# Patient Record
Sex: Female | Born: 1960 | Race: White | Hispanic: No | Marital: Married | Smoking: Never smoker
Health system: Southern US, Community
[De-identification: ages and names within clinical notes are randomized; demographics above are authoritative.]

## PROBLEM LIST (undated history)

## (undated) DIAGNOSIS — K449 Diaphragmatic hernia without obstruction or gangrene: Secondary | ICD-10-CM

## (undated) DIAGNOSIS — K219 Gastro-esophageal reflux disease without esophagitis: Secondary | ICD-10-CM

## (undated) DIAGNOSIS — E049 Nontoxic goiter, unspecified: Secondary | ICD-10-CM

## (undated) HISTORY — PX: ENDOSCOPIC LUMBAR DISCECTOMY W/ LASER: SUR438

---

## 2015-09-05 ENCOUNTER — Other Ambulatory Visit: Payer: Self-pay | Admitting: Family Medicine

## 2015-09-05 DIAGNOSIS — Z1231 Encounter for screening mammogram for malignant neoplasm of breast: Secondary | ICD-10-CM

## 2015-09-06 ENCOUNTER — Ambulatory Visit
Admission: RE | Admit: 2015-09-06 | Discharge: 2015-09-06 | Disposition: A | Discharge status: Home / Self Care | Payer: Managed Care, Other (non HMO) | Source: Ambulatory Visit | Attending: Family Medicine | Admitting: Family Medicine

## 2015-09-06 DIAGNOSIS — Z1231 Encounter for screening mammogram for malignant neoplasm of breast: Secondary | ICD-10-CM | POA: Diagnosis present

## 2015-09-17 ENCOUNTER — Other Ambulatory Visit: Payer: Self-pay | Admitting: Family Medicine

## 2015-09-17 DIAGNOSIS — R928 Other abnormal and inconclusive findings on diagnostic imaging of breast: Secondary | ICD-10-CM

## 2015-10-08 ENCOUNTER — Ambulatory Visit
Admission: RE | Admit: 2015-10-08 | Discharge: 2015-10-08 | Disposition: A | Discharge status: Home / Self Care | Payer: Managed Care, Other (non HMO) | Source: Ambulatory Visit | Attending: Family Medicine | Admitting: Family Medicine

## 2015-10-08 DIAGNOSIS — R928 Other abnormal and inconclusive findings on diagnostic imaging of breast: Secondary | ICD-10-CM | POA: Diagnosis not present

## 2019-05-12 ENCOUNTER — Emergency Department
Admission: EM | Admit: 2019-05-12 | Discharge: 2019-05-13 | Disposition: A | Discharge status: Home / Self Care | Payer: 59 | Attending: Emergency Medicine | Admitting: Emergency Medicine

## 2019-05-12 ENCOUNTER — Emergency Department: Payer: 59

## 2019-05-12 ENCOUNTER — Encounter: Payer: Self-pay | Admitting: Emergency Medicine

## 2019-05-12 ENCOUNTER — Other Ambulatory Visit: Payer: Self-pay

## 2019-05-12 DIAGNOSIS — I10 Essential (primary) hypertension: Secondary | ICD-10-CM | POA: Diagnosis not present

## 2019-05-12 DIAGNOSIS — R0602 Shortness of breath: Secondary | ICD-10-CM | POA: Insufficient documentation

## 2019-05-12 DIAGNOSIS — Z880 Allergy status to penicillin: Secondary | ICD-10-CM | POA: Insufficient documentation

## 2019-05-12 DIAGNOSIS — R0789 Other chest pain: Secondary | ICD-10-CM | POA: Insufficient documentation

## 2019-05-12 DIAGNOSIS — E785 Hyperlipidemia, unspecified: Secondary | ICD-10-CM | POA: Insufficient documentation

## 2019-05-12 DIAGNOSIS — R079 Chest pain, unspecified: Secondary | ICD-10-CM

## 2019-05-12 LAB — BASIC METABOLIC PANEL
Anion gap: 8 (ref 5–15)
BUN: 16 mg/dL (ref 6–20)
CO2: 26 mmol/L (ref 22–32)
Calcium: 9.9 mg/dL (ref 8.9–10.3)
Chloride: 106 mmol/L (ref 98–111)
Creatinine, Ser: 0.61 mg/dL (ref 0.44–1.00)
GFR calc Af Amer: >60 mL/min (ref >60)
GFR calc non Af Amer: >60 mL/min (ref >60)
Glucose, Bld: 113 mg/dL — ABNORMAL HIGH (ref 70–99)
Potassium: 3.9 mmol/L (ref 3.5–5.1)
Sodium: 140 mmol/L (ref 135–145)

## 2019-05-12 LAB — CBC
HCT: 45.2 % (ref 36.0–46.0)
Hemoglobin: 14.6 g/dL (ref 12.0–15.0)
MCH: 29.6 pg (ref 26.0–34.0)
MCHC: 32.3 g/dL (ref 30.0–36.0)
MCV: 91.7 fL (ref 80.0–100.0)
Platelets: 339 10*3/uL (ref 150–400)
RBC: 4.93 MIL/uL (ref 3.87–5.11)
RDW: 14.6 % (ref 11.5–15.5)
WBC: 14.4 10*3/uL — ABNORMAL HIGH (ref 4.0–10.5)
nRBC: 0 % (ref 0.0–0.2)

## 2019-05-12 LAB — TROPONIN I (HIGH SENSITIVITY)
Troponin I (High Sensitivity): <2 ng/L (ref <18)
Troponin I (High Sensitivity): 4 ng/L (ref <18)

## 2019-05-12 LAB — FIBRIN DERIVATIVES D-DIMER (ARMC ONLY): Fibrin derivatives D-dimer (ARMC): 168.67 ng/mL (FEU) (ref 0.00–499.00)

## 2019-05-12 MED ORDER — SODIUM CHLORIDE 0.9% FLUSH: 3.0000 mL | Freq: Once | INTRAVENOUS | Status: DC

## 2019-05-12 MED ORDER — LORAZEPAM 2 MG/ML IJ SOLN
1.0000 mg | Freq: Once | INTRAMUSCULAR | Status: AC | PRN
  Administered 2019-05-13: 1 mg via INTRAVENOUS
  Filled 2019-05-12: qty 1

## 2019-05-12 MED ORDER — SODIUM CHLORIDE 0.9 % IV BOLUS
1000.0000 mL | Freq: Once | INTRAVENOUS | Status: AC
  Administered 2019-05-13: 1000 mL via INTRAVENOUS

## 2019-05-12 NOTE — ED Provider Notes (Signed)
[facility] Emergency Department Provider Note  ____________________________________________   First MD Initiated Contact with Patient 05/12/19 2220     (approximate)  I have reviewed the triage vital signs and the nursing notes.  History  Chief Complaint Chest Pain    HPI Lauren Ball is a 59 y.o. female recently diagnosed with HTN, HLD who presents to the ED for left sided chest pain. Pain started this afternoon without any identifiable trigger. Sharp, left sided and radiates to her shoulder/back and her left arm. No alleviating/aggravating factors. Constant since onset. Associated with mild SOB earlier, which is improving. No nausea or diaphoresis. Recently started on prednisone by her PCP yesterday for back pain. No hx of CAD, DVT or VTE. No leg swelling or hemoptysis.    Past Medical Hx History reviewed. No pertinent past medical history.  Problem List There are no problems to display for this patient.   Past Surgical Hx History reviewed. No pertinent surgical history.  Medications Prior to Admission medications   Not on File    Allergies Penicillin g  Family Hx Family History  Problem Relation Age of Onset  . Breast cancer Neg Hx     Social Hx Social History   Tobacco Use  . Smoking status: Never Smoker  . Smokeless tobacco: Never Used  Substance Use Topics  . Alcohol use: Not Currently  . Drug use: Not on file     Review of Systems  Constitutional: Negative for fever, chills. Eyes: Negative for visual changes. ENT: Negative for sore throat. Cardiovascular: + for chest pain. Respiratory: + for shortness of breath. Gastrointestinal: Negative for nausea, vomiting.  Genitourinary: Negative for dysuria. Musculoskeletal: Negative for leg swelling. Skin: Negative for rash. Neurological: Negative for headaches.   Physical Exam  Vital Signs: ED Triage Vitals  Enc Vitals Group     BP 05/12/19 1840 (!) 180/90   Pulse Rate 05/12/19 1840 (!) 119     Resp 05/12/19 1840 18     Temp 05/12/19 1840 98.6 F (37 C)     Temp Source 05/12/19 1840 Oral     SpO2 05/12/19 1840 99 %     Weight 05/12/19 1841 200 lb (90.7 kg)     Height 05/12/19 1841 5\' 3"  (1.6 m)     Head Circumference --      Peak Flow --      Pain Score 05/12/19 1841 9     Pain Loc --      Pain Edu? --      Excl. in GC? --     Constitutional: Alert and oriented.  Head: Normocephalic. Atraumatic. Eyes: Conjunctivae clear. Sclera anicteric. Nose: No congestion. No rhinorrhea. Mouth/Throat: Wearing mask.  Neck: No stridor.   Cardiovascular: Tachycardic, regular rhythm. Extremities well perfused. Respiratory: Normal respiratory effort.  Lungs CTAB. Gastrointestinal: Soft. Non-tender. Non-distended.  Musculoskeletal: No lower extremity edema. No deformities. Neurologic:  Normal speech and language. No gross focal neurologic deficits are appreciated.  Skin: Skin is warm, dry and intact. No rash noted. Psychiatric: Mood and affect are appropriate for situation.  EKG  Personally reviewed.   Rate: 114 Rhythm: sinus Axis: normal Intervals: WNL Sinus tachycardia, no acute ischemic changes No STEMI    Radiology  CXR: IMPRESSION:  No active cardiopulmonary disease.   CT PE ordered, pending   Procedures  Procedure(s) performed (including critical care):  Procedures   Initial Impression / Assessment and Plan / ED Course  59 y.o. female who presents to the ED  for chest pain, SOB, as above.   Ddx: ACS, PE, pleurisy, anxiety, PTX.  Will obtain labs, EKG, imaging.   Patiently notably tachycardic on exam - states she is somewhat cold and anxious. Ddx for her tachycardia may be related to anxiety/feeling cold vs dehydration, though highest concern w/ her symptoms would be PE. Will give fluids, Ativan, and obtain CT PE to rule out.   EKG sinus tachycardia, no acute ischemic changes, troponin x 2 negative.   Pending CT PE,  if negative, anticipate discharge.    Final Clinical Impression(s) / ED Diagnosis  Final diagnoses:  Chest pain in adult       Note:  This document was prepared using Dragon voice recognition software and may include unintentional dictation errors.   Lilia Pro., MD 05/13/19 [phone]

## 2019-05-12 NOTE — ED Triage Notes (Signed)
Pt here with c/o left upper back pain at her shoulder blade states pain feels like it's going through to her left chest, pain in her left neck and left arm as well, began a few hours ago, was started on prednisone for back pain yesterday. NAD.

## 2019-05-12 NOTE — ED Notes (Signed)
2 unsuccesful IV attempts by this RN (right and left AC).

## 2019-05-12 NOTE — ED Notes (Signed)
Pt to ED c/o left sided CP that started this afternoon along with HTN. Reports she checked her BP this afternoon and it was 180's systolic. Reports her BP normally runs 130's systolic, takes losartan.  Denies N/V. Reports SHOB with CP earlier but denies SHOB at the moment .  Reports she started taking prednisone yesterday for back pain.  Pt in NAD at this time.

## 2019-05-13 ENCOUNTER — Emergency Department: Payer: 59

## 2019-05-13 LAB — T4, FREE: Free T4: 0.97 ng/dL (ref 0.61–1.12)

## 2019-05-13 LAB — CK: Total CK: 82 U/L (ref 38–234)

## 2019-05-13 LAB — TSH: TSH: 1.126 u[IU]/mL (ref 0.350–4.500)

## 2019-05-13 MED ORDER — IOHEXOL 350 MG/ML SOLN
75.0000 mL | Freq: Once | INTRAVENOUS | Status: AC | PRN
  Administered 2019-05-13: 01:00:00 75 mL via INTRAVENOUS

## 2019-05-13 MED ORDER — ONDANSETRON HCL 4 MG/2ML IJ SOLN
4.0000 mg | Freq: Once | INTRAMUSCULAR | Status: AC
  Administered 2019-05-13: 4 mg via INTRAVENOUS
  Filled 2019-05-13: qty 2

## 2019-05-13 MED ORDER — SODIUM CHLORIDE 0.9 % IV BOLUS
1000.0000 mL | Freq: Once | INTRAVENOUS | Status: AC
  Administered 2019-05-13: 1000 mL via INTRAVENOUS

## 2019-05-13 MED ORDER — FENTANYL CITRATE (PF) 100 MCG/2ML IJ SOLN
50.0000 ug | Freq: Once | INTRAMUSCULAR | Status: AC
  Administered 2019-05-13: 50 ug via INTRAVENOUS
  Filled 2019-05-13: qty 2

## 2019-05-13 NOTE — Discharge Instructions (Signed)
Your heart rate was elevated.  Your work-up was negative.  You should follow-up with your primary care doctor on Monday for heart rate recheck.  You should discontinue steroids for now.  Return to ER if you develop worsening chest pain, shortness of breath, fevers or any other concerns  IMPRESSION: No evidence of pulmonary embolus.   No acute cardiopulmonary disease.   Small hiatal hernia.

## 2019-05-13 NOTE — ED Provider Notes (Addendum)
1:32 AM Assumed care for off going team.   Blood pressure (!) 176/98, pulse (!) 126, temperature 98.6 F (37 C), temperature source Oral, resp. rate 18, height 5\' 3"  (1.6 m), weight 90.7 kg, last menstrual period 07/14/2015, SpO2 100 %.  See their HPI for full report but in brief Pending CT PE-Pt with chest pain, sob, tachycardiac. Ativan and fluids pending. Plan to d/c if CT negative.   White count slightly elevated but patient was to start on steroids.  Denies any infectious symptoms and patient is afebrile so I do not think she is septic.  HR has come down to 115.  Discussed with patient given she has a thyroid nodule and she was recently discontinued off her statin will check CK level and thyroid levels to make sure her neither of these are causing her heart rate issue.  We will give another 1 L of fluid and some pain medication to see if that could be contributing.  Discussed with patient that we could admit her for observation for cardiology and echo versus this could be secondary to the prednisone she was recently started on and she can follow-up with her primary care doctor on 2 days or return to the ER if her symptoms were worsening.   4:05 AM reevaluated patient heart rates come down to 110.  Discussed with patient admission for echocardiogram and cardiac monitoring versus discharge.  Patient is asymptomatic at this time and feels comfortable going home.  We discussed holding off on her steroids and she did have much improvement in her pain anyways and we can see if stopping this will help decrease her heart rates.  We we discussed Covid testing but she denies any other symptoms related to the is and declines testing at this time.    She plans to follow-up on Monday for a heart rate recheck with her primary care doctor.  Patient feels comfortable with this plan and will return to the ER if she develops any other concerns'    I discussed the provisional nature of ED diagnosis, the treatment  so far, the ongoing plan of care, follow up appointments and return precautions with the patient and any family or support people present. They expressed understanding and agreed with the plan, discharged home.          Thursday, MD 05/13/19 0407    05/15/19, MD 05/13/19 [phone]

## 2019-05-13 NOTE — ED Notes (Signed)
Family updated as to patient's status.

## 2019-05-13 NOTE — ED Notes (Signed)
Pt taken to CT.

## 2019-05-13 NOTE — ED Notes (Signed)
Pt denies cp at this time but st she is still having backpain

## 2021-07-22 ENCOUNTER — Other Ambulatory Visit: Payer: Self-pay | Admitting: Family Medicine

## 2021-07-22 DIAGNOSIS — Z1231 Encounter for screening mammogram for malignant neoplasm of breast: Secondary | ICD-10-CM

## 2021-07-23 ENCOUNTER — Ambulatory Visit
Admission: RE | Admit: 2021-07-23 | Discharge: 2021-07-23 | Disposition: A | Discharge status: Home / Self Care | Payer: No Typology Code available for payment source | Source: Ambulatory Visit | Attending: Family Medicine | Admitting: Family Medicine

## 2021-07-23 DIAGNOSIS — Z1231 Encounter for screening mammogram for malignant neoplasm of breast: Secondary | ICD-10-CM | POA: Diagnosis present

## 2021-07-25 ENCOUNTER — Other Ambulatory Visit: Payer: Self-pay | Admitting: Family Medicine

## 2021-07-25 DIAGNOSIS — N63 Unspecified lump in unspecified breast: Secondary | ICD-10-CM

## 2021-07-25 DIAGNOSIS — R928 Other abnormal and inconclusive findings on diagnostic imaging of breast: Secondary | ICD-10-CM

## 2021-08-05 ENCOUNTER — Ambulatory Visit
Admission: RE | Admit: 2021-08-05 | Discharge: 2021-08-05 | Disposition: A | Discharge status: Home / Self Care | Payer: No Typology Code available for payment source | Source: Ambulatory Visit | Attending: Family Medicine | Admitting: Family Medicine

## 2021-08-05 DIAGNOSIS — N63 Unspecified lump in unspecified breast: Secondary | ICD-10-CM | POA: Insufficient documentation

## 2021-08-05 DIAGNOSIS — R928 Other abnormal and inconclusive findings on diagnostic imaging of breast: Secondary | ICD-10-CM | POA: Insufficient documentation

## 2021-12-26 ENCOUNTER — Other Ambulatory Visit: Payer: Self-pay | Admitting: Family Medicine

## 2021-12-26 DIAGNOSIS — R928 Other abnormal and inconclusive findings on diagnostic imaging of breast: Secondary | ICD-10-CM

## 2022-02-05 ENCOUNTER — Ambulatory Visit
Admission: RE | Admit: 2022-02-05 | Discharge: 2022-02-05 | Disposition: A | Discharge status: Home / Self Care | Payer: No Typology Code available for payment source | Source: Ambulatory Visit | Attending: Family Medicine | Admitting: Family Medicine

## 2022-02-05 ENCOUNTER — Other Ambulatory Visit: Payer: No Typology Code available for payment source

## 2022-02-05 DIAGNOSIS — R928 Other abnormal and inconclusive findings on diagnostic imaging of breast: Secondary | ICD-10-CM

## 2022-06-23 ENCOUNTER — Other Ambulatory Visit: Payer: Self-pay | Admitting: Family Medicine

## 2022-06-23 DIAGNOSIS — Z1231 Encounter for screening mammogram for malignant neoplasm of breast: Secondary | ICD-10-CM

## 2022-08-07 ENCOUNTER — Ambulatory Visit
Admission: RE | Admit: 2022-08-07 | Discharge: 2022-08-07 | Disposition: A | Discharge status: Home / Self Care | Payer: No Typology Code available for payment source | Source: Ambulatory Visit | Attending: Family Medicine | Admitting: Family Medicine

## 2022-08-07 DIAGNOSIS — Z1231 Encounter for screening mammogram for malignant neoplasm of breast: Secondary | ICD-10-CM | POA: Insufficient documentation

## 2022-08-22 ENCOUNTER — Encounter: Payer: Self-pay | Admitting: Gastroenterology

## 2022-08-24 NOTE — H&P (Signed)
Pre-Procedure H&P   Patient ID: Lauren Ball is a 62 y.o. female.  Gastroenterology Provider: Jaynie Collins, DO  Referring Provider: Fransico Setters, NP PCP: Ardyth Man, PA-C  Date: 08/25/2022  HPI Ms. Lauren Ball is a 62 y.o. female who presents today for Colonoscopy for Bright red blood per rectum .  Patient has noted bright red blood per rectum on a weekly basis since December 2023.  She has not previously undergone colonoscopy.  She has a bloody streaked on the outside of the stool and on the tissue paper but not in the toilet.  Reports regular bowel movements twice a day She denies abdominal pain urgency tenesmus nocturnal awakenings melena.  No weight or appetite changes  Hemoglobin 14 MCV 93 platelets 315,000 creatinine 0.7  She has a family history of ulcerative colitis in her son and her father.  No family history of colon cancer or colon polyps   Past Medical History:  Diagnosis Date   GERD (gastroesophageal reflux disease)    Goiter, nodular    Hiatal hernia     Past Surgical History:  Procedure Laterality Date   ENDOSCOPIC LUMBAR DISCECTOMY W/ LASER      Family History Father and son with ulcerative colitis No h/o GI disease or malignancy  Review of Systems  Constitutional:  Negative for activity change, appetite change, chills, diaphoresis, fatigue, fever and unexpected weight change.  HENT:  Negative for trouble swallowing and voice change.   Respiratory:  Negative for shortness of breath and wheezing.   Cardiovascular:  Negative for chest pain, palpitations and leg swelling.  Gastrointestinal:  Positive for blood in stool. Negative for abdominal distention, abdominal pain, anal bleeding, constipation, diarrhea, nausea, rectal pain and vomiting.  Musculoskeletal:  Negative for arthralgias and myalgias.  Skin:  Negative for color change and pallor.  Neurological:  Negative for dizziness, syncope and weakness.  Psychiatric/Behavioral:   Negative for confusion.   All other systems reviewed and are negative.    Medications No current facility-administered medications on file prior to encounter.   Current Outpatient Medications on File Prior to Encounter  Medication Sig Dispense Refill   Cholecalciferol 25 MCG (1000 UT) tablet Take 1,000 Units by mouth daily.     fluticasone (FLONASE) 50 MCG/ACT nasal spray Place into both nostrils daily.     losartan (COZAAR) 50 MG tablet Take 50 mg by mouth daily.     Multiple Vitamin (MULTIVITAMIN) tablet Take 1 tablet by mouth daily.     rosuvastatin (CRESTOR) 5 MG tablet Take 5 mg by mouth daily.      Pertinent medications related to GI and procedure were reviewed by me with the patient prior to the procedure   Current Facility-Administered Medications:    0.9 %  sodium chloride infusion, , Intravenous, Continuous, Jaynie Collins, DO, Last Rate: 20 mL/hr at 08/25/22 1415, 20 mL/hr at 08/25/22 1415  sodium chloride 20 mL/hr (08/25/22 1415)       Allergies  Allergen Reactions   Prednisone    Penicillin G Rash   Allergies were reviewed by me prior to the procedure  Objective   Body mass index is 34.9 kg/m. Vitals:   08/25/22 1359  BP: (!) 152/80  Pulse: 93  Resp: 20  Temp: 98.1 F (36.7 C)  TempSrc: Temporal  SpO2: 100%  Weight: 89.4 kg  Height: 5\' 3"  (1.6 m)     Physical Exam Vitals and nursing note reviewed.  Constitutional:      General: She is  not in acute distress.    Appearance: Normal appearance. She is obese. She is not ill-appearing, toxic-appearing or diaphoretic.  HENT:     Head: Normocephalic and atraumatic.     Nose: Nose normal.     Mouth/Throat:     Mouth: Mucous membranes are moist.     Pharynx: Oropharynx is clear.  Eyes:     General: No scleral icterus.    Extraocular Movements: Extraocular movements intact.  Cardiovascular:     Rate and Rhythm: Normal rate and regular rhythm.     Heart sounds: Normal heart sounds. No murmur  heard.    No friction rub. No gallop.  Pulmonary:     Effort: Pulmonary effort is normal. No respiratory distress.     Breath sounds: Normal breath sounds. No wheezing, rhonchi or rales.  Abdominal:     General: Bowel sounds are normal. There is no distension.     Palpations: Abdomen is soft.     Tenderness: There is no abdominal tenderness. There is no guarding or rebound.  Musculoskeletal:     Cervical back: Neck supple.     Right lower leg: No edema.     Left lower leg: No edema.  Skin:    General: Skin is warm and dry.     Coloration: Skin is not jaundiced or pale.  Neurological:     General: No focal deficit present.     Mental Status: She is alert and oriented to person, place, and time. Mental status is at baseline.  Psychiatric:        Mood and Affect: Mood normal.        Behavior: Behavior normal.        Thought Content: Thought content normal.        Judgment: Judgment normal.      Assessment:  Ms. Lauren Ball is a 62 y.o. female  who presents today for Colonoscopy for Bright red blood per rectum .  Plan:  Colonoscopy with possible intervention today  Colonoscopy with possible biopsy, control of bleeding, polypectomy, and interventions as necessary has been discussed with the patient/patient representative. Informed consent was obtained from the patient/patient representative after explaining the indication, nature, and risks of the procedure including but not limited to death, bleeding, perforation, missed neoplasm/lesions, cardiorespiratory compromise, and reaction to medications. Opportunity for questions was given and appropriate answers were provided. Patient/patient representative has verbalized understanding is amenable to undergoing the procedure.   Jaynie Collins, DO  [facility]  Portions of the record may have been created with voice recognition software. Occasional wrong-word or 'sound-a-like' substitutions may have occurred  due to the inherent limitations of voice recognition software.  Read the chart carefully and recognize, using context, where substitutions may have occurred.

## 2022-08-25 ENCOUNTER — Encounter
Admission: RE | Disposition: A | Discharge status: Home / Self Care | Payer: Self-pay | Source: Home / Self Care | Attending: Gastroenterology

## 2022-08-25 ENCOUNTER — Ambulatory Visit: Payer: 59 | Admitting: Anesthesiology

## 2022-08-25 ENCOUNTER — Ambulatory Visit
Admission: RE | Admit: 2022-08-25 | Discharge: 2022-08-25 | Disposition: A | Discharge status: Home / Self Care | Payer: 59 | Attending: Gastroenterology | Admitting: Gastroenterology

## 2022-08-25 ENCOUNTER — Encounter: Payer: Self-pay | Admitting: Gastroenterology

## 2022-08-25 DIAGNOSIS — D125 Benign neoplasm of sigmoid colon: Secondary | ICD-10-CM | POA: Insufficient documentation

## 2022-08-25 DIAGNOSIS — K921 Melena: Secondary | ICD-10-CM | POA: Diagnosis present

## 2022-08-25 DIAGNOSIS — Z8379 Family history of other diseases of the digestive system: Secondary | ICD-10-CM | POA: Diagnosis not present

## 2022-08-25 DIAGNOSIS — I10 Essential (primary) hypertension: Secondary | ICD-10-CM | POA: Diagnosis not present

## 2022-08-25 DIAGNOSIS — D128 Benign neoplasm of rectum: Secondary | ICD-10-CM | POA: Diagnosis not present

## 2022-08-25 DIAGNOSIS — K640 First degree hemorrhoids: Secondary | ICD-10-CM | POA: Insufficient documentation

## 2022-08-25 HISTORY — DX: Gastro-esophageal reflux disease without esophagitis: K21.9

## 2022-08-25 HISTORY — DX: Nontoxic goiter, unspecified: E04.9

## 2022-08-25 HISTORY — DX: Diaphragmatic hernia without obstruction or gangrene: K44.9

## 2022-08-25 HISTORY — PX: COLONOSCOPY: SHX5424

## 2022-08-25 SURGERY — COLONOSCOPY
Anesthesia: General

## 2022-08-25 MED ORDER — PROPOFOL 10 MG/ML IV BOLUS
INTRAVENOUS | Status: DC | PRN
  Administered 2022-08-25: 50 mg via INTRAVENOUS
  Administered 2022-08-25: 100 mg via INTRAVENOUS
  Administered 2022-08-25: 50 mg via INTRAVENOUS

## 2022-08-25 MED ORDER — ONDANSETRON HCL 4 MG/2ML IJ SOLN
INTRAMUSCULAR | Status: DC | PRN
  Administered 2022-08-25: 4 mg via INTRAVENOUS

## 2022-08-25 MED ORDER — SPOT INK MARKER SYRINGE KIT
PACK | SUBMUCOSAL | Status: DC | PRN
  Administered 2022-08-25: 2 mL via SUBMUCOSAL

## 2022-08-25 MED ORDER — PROPOFOL 10 MG/ML IV BOLUS
INTRAVENOUS | Status: AC
  Filled 2022-08-25: qty 20

## 2022-08-25 MED ORDER — SODIUM CHLORIDE (PF) 0.9 % IJ SOLN
PREFILLED_SYRINGE | INTRAMUSCULAR | Status: DC | PRN
  Administered 2022-08-25: 1 mL
  Administered 2022-08-25: .5 mL

## 2022-08-25 MED ORDER — LIDOCAINE HCL (CARDIAC) PF 100 MG/5ML IV SOSY
PREFILLED_SYRINGE | INTRAVENOUS | Status: DC | PRN
  Administered 2022-08-25: 50 mg via INTRAVENOUS

## 2022-08-25 MED ORDER — PROPOFOL 500 MG/50ML IV EMUL
INTRAVENOUS | Status: DC | PRN
  Administered 2022-08-25: 100 ug/kg/min via INTRAVENOUS

## 2022-08-25 MED ORDER — EPINEPHRINE 1 MG/10ML IJ SOSY
PREFILLED_SYRINGE | INTRAMUSCULAR | Status: AC
  Filled 2022-08-25: qty 10

## 2022-08-25 MED ORDER — SODIUM CHLORIDE 0.9 % IV SOLN
INTRAVENOUS | Status: DC
  Administered 2022-08-25: 20 mL/h via INTRAVENOUS

## 2022-08-25 NOTE — Transfer of Care (Signed)
Immediate Anesthesia Transfer of Care Note  Patient: Lauren Ball  Procedure(s) Performed: COLONOSCOPY  Patient Location: PACU and Endoscopy Unit  Anesthesia Type:General  Level of Consciousness: awake, alert , oriented, and patient cooperative  Airway & Oxygen Therapy: Patient Spontanous Breathing  Post-op Assessment: Report given to RN and Post -op Vital signs reviewed and stable  Post vital signs: Reviewed and stable  Last Vitals:  Vitals Value Taken Time  BP 182/94 08/25/22 1541  Temp 36.7 C 08/25/22 1540  Pulse 112 08/25/22 1542  Resp 16 08/25/22 1542  SpO2 100 % 08/25/22 1542  Vitals shown include unvalidated device data.  Last Pain:  Vitals:   08/25/22 1540  TempSrc: Tympanic  PainSc: Asleep         Complications: No notable events documented.

## 2022-08-25 NOTE — Op Note (Signed)
[facility] Gastroenterology Patient Name: Lauren Ball Procedure Date: 08/25/2022 2:48 PM MRN: [id number] Account #: [id number] Date of Birth: 09/11/1960 Admit Type: Outpatient Age: 62 Room: [facility] ENDO ROOM 2 Gender: Female Note Status: Finalized Instrument Name: Colonoscope [id number] Procedure:             Colonoscopy Indications:           Hematochezia Providers:             Trenda Moots, DO Referring MD:          Ardyth Man [facility] Medicines:             Monitored Anesthesia Care Complications:         No immediate complications. Estimated blood loss:                         Minimal. Procedure:             Pre-Anesthesia Assessment:                        - Prior to the procedure, a History and Physical was                         performed, and patient medications and allergies were                         reviewed. The patient is competent. The risks and                         benefits of the procedure and the sedation options and                         risks were discussed with the patient. All questions                         were answered and informed consent was obtained.                         Patient identification and proposed procedure were                         verified by the physician, the nurse, the anesthetist                         and the technician in the endoscopy suite. Mental                         Status Examination: alert and oriented. Airway                         Examination: normal oropharyngeal airway and neck                         mobility. Respiratory Examination: clear to                         auscultation. CV Examination: RRR, no murmurs, no S3  or S4. Prophylactic Antibiotics: The patient does not                         require prophylactic antibiotics. Prior                         Anticoagulants: The patient has taken no anticoagulant                         or antiplatelet  agents. ASA Grade Assessment: II - A                         patient with mild systemic disease. After reviewing                         the risks and benefits, the patient was deemed in                         satisfactory condition to undergo the procedure. The                         anesthesia plan was to use monitored anesthesia care                         (MAC). Immediately prior to administration of                         medications, the patient was re-assessed for adequacy                         to receive sedatives. The heart rate, respiratory                         rate, oxygen saturations, blood pressure, adequacy of                         pulmonary ventilation, and response to care were                         monitored throughout the procedure. The physical                         status of the patient was re-assessed after the                         procedure.                        After obtaining informed consent, the colonoscope was                         passed under direct vision. Throughout the procedure,                         the patient's blood pressure, pulse, and oxygen                         saturations were monitored continuously. The  Colonoscope was introduced through the anus and                         advanced to the the terminal ileum, with                         identification of the appendiceal orifice and IC                         valve. The colonoscopy was performed without                         difficulty. The patient tolerated the procedure well.                         The quality of the bowel preparation was evaluated                         using the BBPS [facility] Bowel Preparation Scale) with                         scores of: Right Colon = 2 (minor amount of residual                         staining, small fragments of stool and/or opaque                         liquid, but mucosa seen well), Transverse Colon =  3                         (entire mucosa seen well with no residual staining,                         small fragments of stool or opaque liquid) and Left                         Colon = 3 (entire mucosa seen well with no residual                         staining, small fragments of stool or opaque liquid).                         The total BBPS score equals 8. The quality of the                         bowel preparation was excellent. The terminal ileum,                         ileocecal valve, appendiceal orifice, and rectum were                         photographed. Findings:      The perianal and digital rectal examinations were normal. Pertinent       negatives include normal sphincter tone.      The terminal ileum appeared normal. Estimated blood loss: none.      Retroflexion in the right colon was performed.  Non-bleeding internal hemorrhoids were found during retroflexion. The       hemorrhoids were Grade I (internal hemorrhoids that do not prolapse).       Estimated blood loss: none.      A 1 to 2 mm polyp was found in the rectum. The polyp was sessile. The       polyp was removed with a jumbo cold forceps. Resection and retrieval       were complete. Estimated blood loss was minimal.      An 11 to 13 mm polyp was found in the sigmoid colon. The polyp was       pedunculated. Area was successfully injected with 1 mL of a 0.1 mg/mL       solution of epinephrine for hemostasis. The polyp was removed with a hot       snare. Resection and retrieval were complete. Estimated blood loss was       minimal.      A 23 to 26 mm polyp was found in the sigmoid colon. The polyp was       pedunculated. Area was successfully injected with 2 mL of a 0.1 mg/mL       solution of epinephrine for hemostasis. The polyp was removed with a hot       snare. Resection and retrieval were complete. To prevent bleeding after       the polypectomy, one hemostatic clip was successfully placed (MR        conditional). There was no bleeding at the end of the procedure. Area       was tattooed with an injection of 3 mL of [place] ink. 1cc and 1.5 cc were       placed on the proximal side of the proximal sigmoid polyp and the distal       side (rectal side) of the distal sigmoid polyp to book end these       lesions. Estimated blood loss was minimal.      The exam was otherwise without abnormality on direct and retroflexion       views. Impression:            - The examined portion of the ileum was normal.                        - Non-bleeding internal hemorrhoids.                        - One 1 to 2 mm polyp in the rectum, removed with a                         jumbo cold forceps. Resected and retrieved.                        - One 11 to 13 mm polyp in the sigmoid colon, removed                         with a hot snare. Resected and retrieved. Injected.                        - One 23 to 26 mm polyp in the sigmoid colon, removed  with a hot snare. Resected and retrieved. Injected.                         Clip (MR conditional) was placed. Tattooed.                        - The examination was otherwise normal on direct and                         retroflexion views. Recommendation:        - Patient has a contact number available for                         emergencies. The signs and symptoms of potential                         delayed complications were discussed with the patient.                         Return to normal activities tomorrow. Written                         discharge instructions were provided to the patient.                        - Discharge patient to home.                        - Resume previous diet.                        - Continue present medications.                        - No aspirin, ibuprofen, naproxen, or other                         non-steroidal anti-inflammatory drugs for 5 days after                         polyp removal.                         - Await pathology results.                        - Repeat colonoscopy for surveillance based on                         pathology results.                        - Return to referring physician as previously                         scheduled.                        - The findings and recommendations were discussed with                         the patient.  Procedure Code(s):     --- Professional ---                        [phone], Colonoscopy, flexible; with removal of                         tumor(s), polyp(s), or other lesion(s) by snare                         technique                        45380, 59, Colonoscopy, flexible; with biopsy, single                         or multiple                        45381, Colonoscopy, flexible; with directed submucosal                         injection(s), any substance Diagnosis Code(s):     --- Professional ---                        K64.0, First degree hemorrhoids                        D12.8, Benign neoplasm of rectum                        D12.5, Benign neoplasm of sigmoid colon                        K92.1, Melena (includes Hematochezia) CPT copyright 2022 American Medical Association. All rights reserved. The codes documented in this report are preliminary and upon coder review may  be revised to meet current compliance requirements. Attending Participation:      I personally performed the entire procedure. Elfredia Nevins, DO Jaynie Collins DO, DO 08/25/2022 3:42:49 PM This report has been signed electronically. Number of Addenda: 0 Note Initiated On: 08/25/2022 2:48 PM Estimated Blood Loss:  Estimated blood loss was minimal.      [facility]

## 2022-08-25 NOTE — Interval H&P Note (Signed)
History and Physical Interval Note: Preprocedure H&P from 08/25/22  was reviewed and there was no interval change after seeing and examining the patient.  Written consent was obtained from the patient after discussion of risks, benefits, and alternatives. Patient has consented to proceed with Colonoscopy with possible intervention   08/25/2022 2:51 PM  Lauren Ball  has presented today for surgery, with the diagnosis of BRBPR (bright red blood per rectum) (K62.5) Family history of ulcerative colitis (Z83.79).  The various methods of treatment have been discussed with the patient and family. After consideration of risks, benefits and other options for treatment, the patient has consented to  Procedure(s): COLONOSCOPY (N/A) as a surgical intervention.  The patient's history has been reviewed, patient examined, no change in status, stable for surgery.  I have reviewed the patient's chart and labs.  Questions were answered to the patient's satisfaction.     Jaynie Collins

## 2022-08-25 NOTE — Anesthesia Preprocedure Evaluation (Addendum)
Anesthesia Evaluation  Patient identified by MRN, date of birth, ID band Patient awake    Reviewed: Allergy & Precautions, NPO status , Patient's Chart, lab work & pertinent test results  History of Anesthesia Complications Negative for: history of anesthetic complications  Airway Mallampati: IV   Neck ROM: Full    Dental no notable dental hx.    Pulmonary neg pulmonary ROS   Pulmonary exam normal breath sounds clear to auscultation       Cardiovascular hypertension, Normal cardiovascular exam Rhythm:Regular Rate:Normal     Neuro/Psych negative neurological ROS     GI/Hepatic hiatal hernia,GERD  ,,  Endo/Other  Obesity   Renal/GU negative Renal ROS     Musculoskeletal   Abdominal   Peds  Hematology negative hematology ROS (+)   Anesthesia Other Findings   Reproductive/Obstetrics                             Anesthesia Physical Anesthesia Plan  ASA: 2  Anesthesia Plan: General   Post-op Pain Management:    Induction: Intravenous  PONV Risk Score and Plan: 3 and Propofol infusion, TIVA and Treatment may vary due to age or medical condition  Airway Management Planned: Natural Airway  Additional Equipment:   Intra-op Plan:   Post-operative Plan:   Informed Consent: I have reviewed the patients History and Physical, chart, labs and discussed the procedure including the risks, benefits and alternatives for the proposed anesthesia with the patient or authorized representative who has indicated his/her understanding and acceptance.       Plan Discussed with: CRNA  Anesthesia Plan Comments: (LMA/GETA backup discussed.  Patient consented for risks of anesthesia including but not limited to:  - adverse reactions to medications - damage to eyes, teeth, lips or other oral mucosa - nerve damage due to positioning  - sore throat or hoarseness - damage to heart, brain, nerves,  lungs, other parts of body or loss of life  Informed patient about role of CRNA in peri- and intra-operative care.  Patient voiced understanding.)        Anesthesia Quick Evaluation

## 2022-08-25 NOTE — Care Plan (Signed)
Patient with abdominal discomfort after procedure. Feels like she needs to urinate.  Encouraged her to roll from side to side in bed and continue to pass gas.  Abdomen is soft on exam and no increased pain with palpation. Vital signs are unchanged from pre/during procedure. Mildly tachycardic 100-110. BP 170/52  Gave her instructions on when call back/return to hospital is warranted.  Enis Slipper, DO [facility]

## 2022-08-26 ENCOUNTER — Encounter: Payer: Self-pay | Admitting: Gastroenterology

## 2022-08-27 LAB — SURGICAL PATHOLOGY

## 2022-08-27 NOTE — Anesthesia Postprocedure Evaluation (Signed)
Anesthesia Post Note  Patient: Biomedical scientist  Procedure(s) Performed: COLONOSCOPY  Patient location during evaluation: PACU Anesthesia Type: General Level of consciousness: awake and alert, oriented and patient cooperative Pain management: pain level controlled Vital Signs Assessment: post-procedure vital signs reviewed and stable Respiratory status: spontaneous breathing, nonlabored ventilation and respiratory function stable Cardiovascular status: blood pressure returned to baseline and stable Postop Assessment: adequate PO intake Anesthetic complications: no   No notable events documented.   Last Vitals:  Vitals:   08/25/22 1550 08/25/22 1600  BP: (!) 175/87 (!) 165/69  Pulse: (!) 104 99  Resp: 17 14  Temp:    SpO2: 100% 100%    Last Pain:  Vitals:   08/25/22 1600  TempSrc:   PainSc: 0-No pain                 Reed Breech

## 2022-10-14 IMAGING — MG MM DIGITAL DIAGNOSTIC UNILAT*L* W/ TOMO W/ CAD
4 series · 4 of 12 positions shown · non-contrast
Comparison: Previous exam(s).

CLINICAL DATA: 60-year-old female recalled from screening mammogram
dated 07/23/2021 for a possible left breast mass.

EXAM:
DIGITAL DIAGNOSTIC UNILATERAL LEFT MAMMOGRAM WITH TOMOSYNTHESIS AND
CAD; ULTRASOUND LEFT BREAST LIMITED
TECHNIQUE: Left digital diagnostic mammography and breast tomosynthesis was
performed. The images were evaluated with computer-aided detection.;
Targeted ultrasound examination of the left breast was performed.

[L MLO synth-2D]
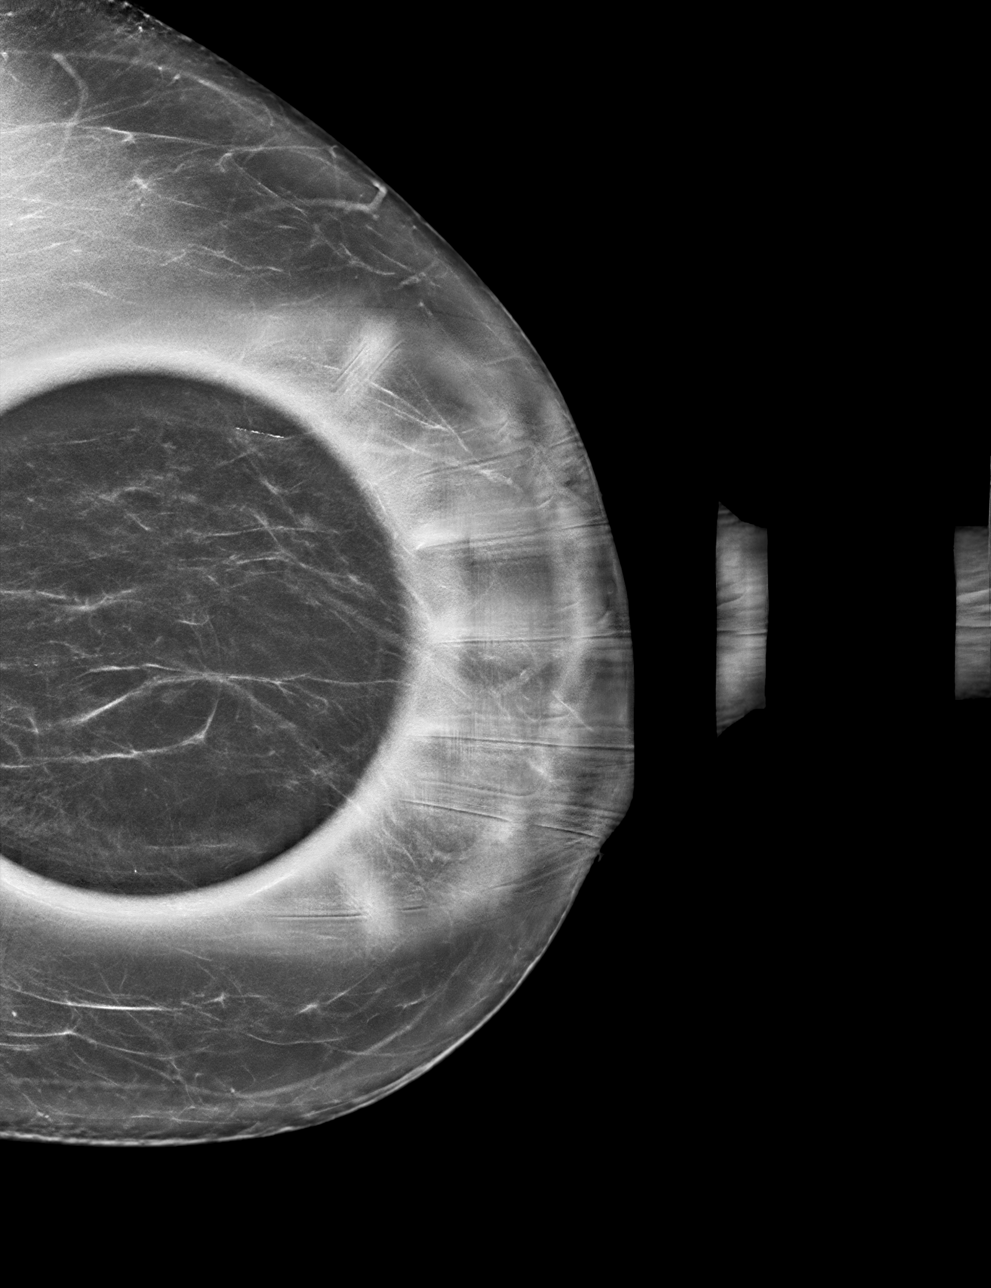

[L CC synth-2D]
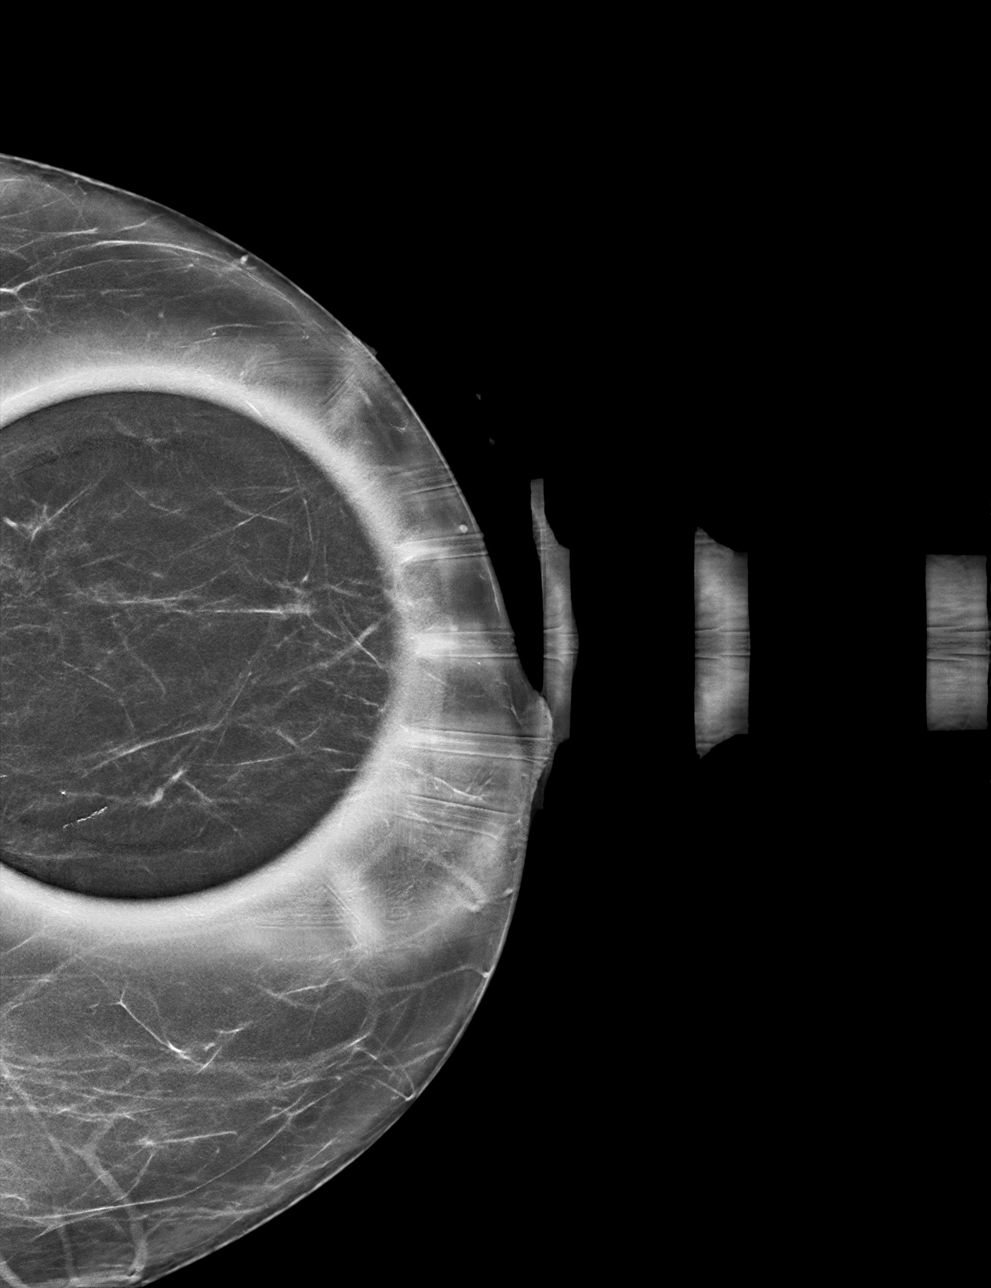

[L MLO tomo · tomo slice 39/76.0]
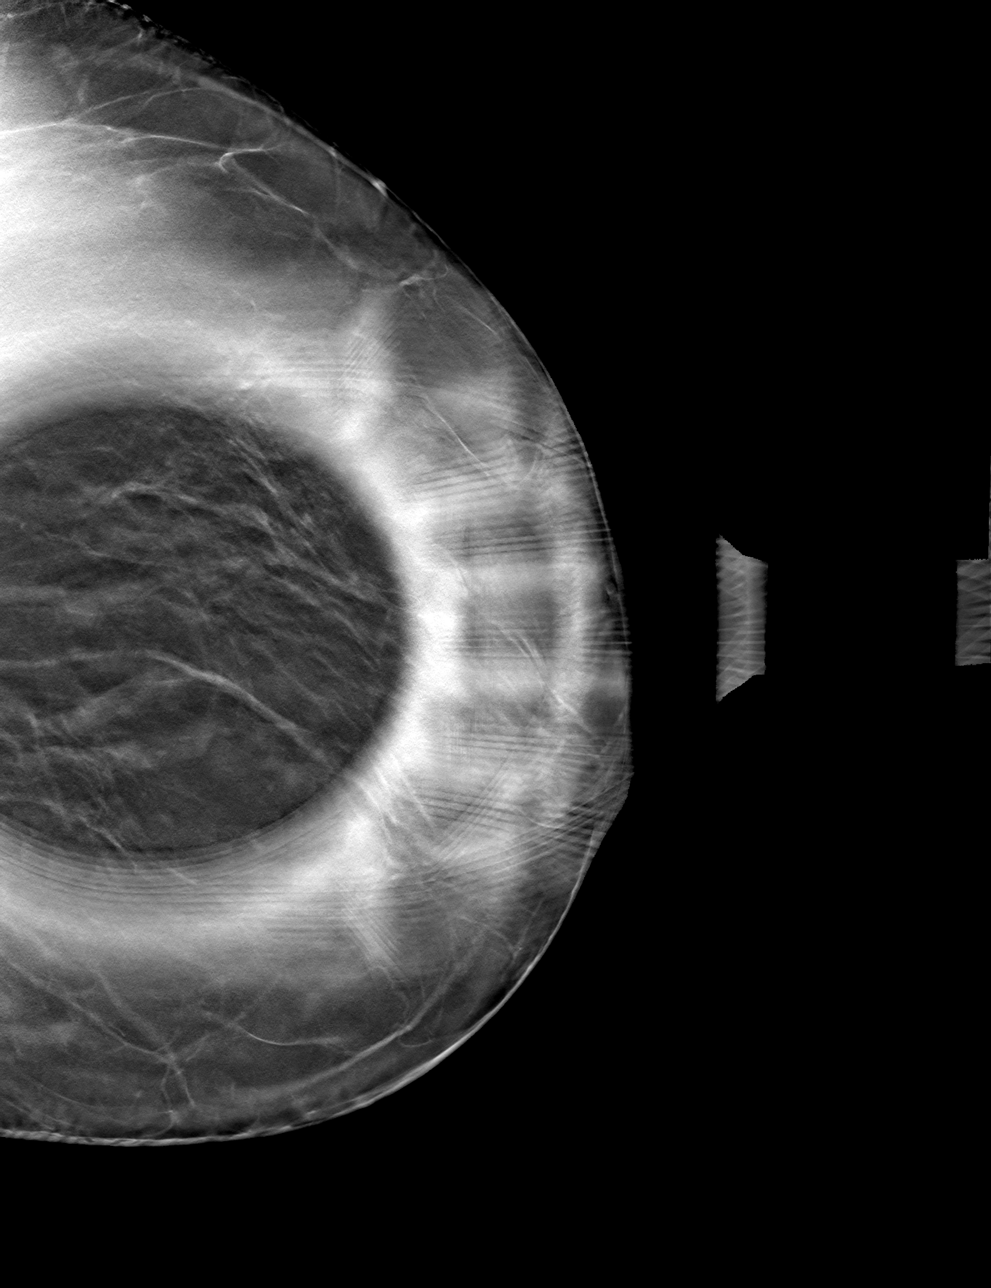

[L CC tomo · tomo slice 32/63.0]
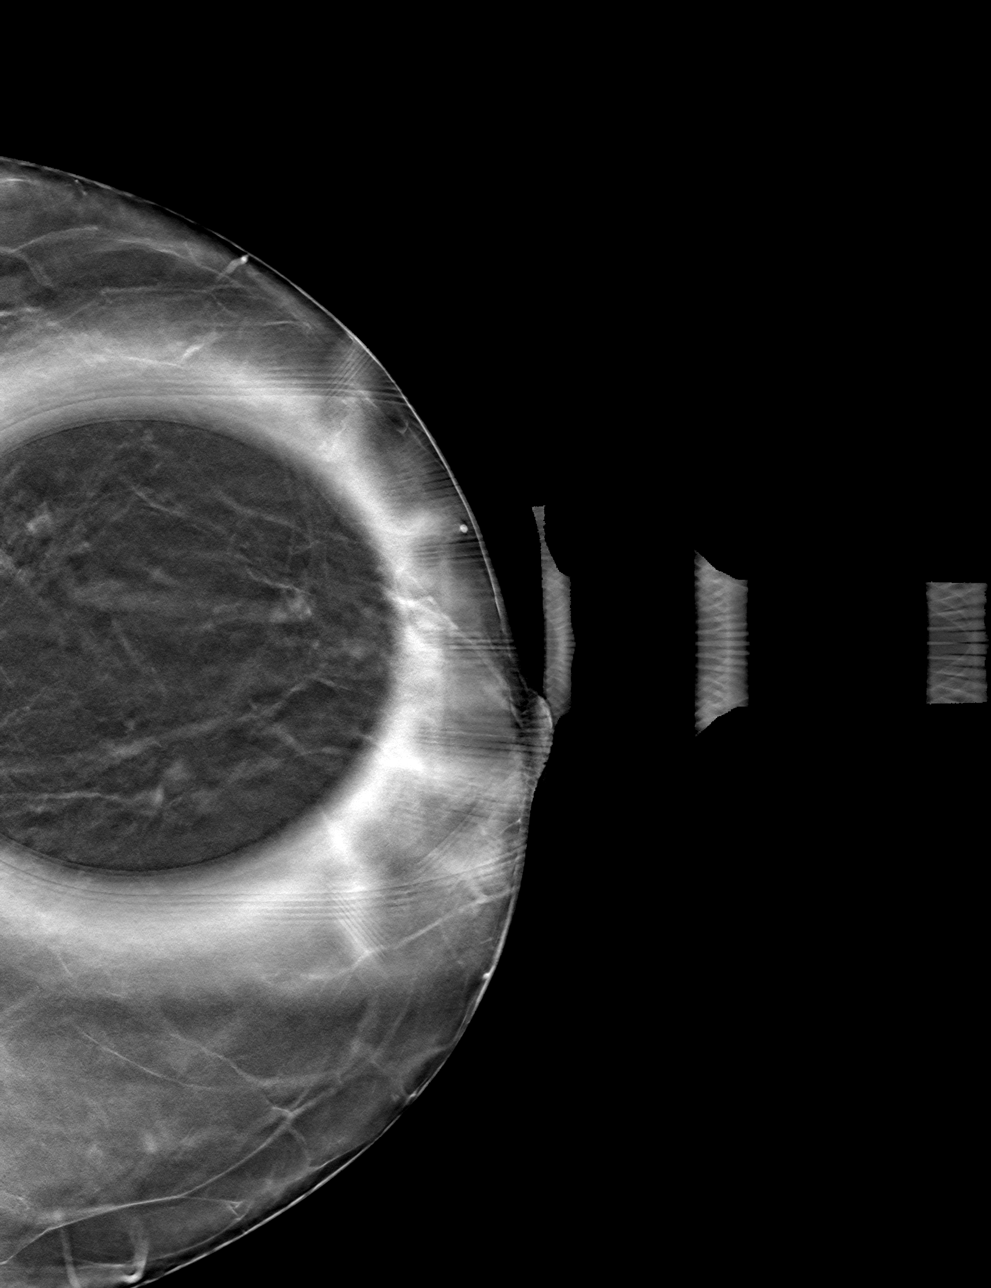

[4 of 12 positions shown; findings below may reference images not displayed]

ACR Breast Density Category b: There are scattered areas of
fibroglandular density.
FINDINGS: There is a persistent oval, circumscribed equal density mass in the
slightly lower outer left breast at mid depth. It measures up to 5
mm. Further evaluation with ultrasound was performed.

Targeted ultrasound is performed, showing an oval, circumscribed
hypoechoic mass at the [DATE] position 5 cm from the nipple. It
measures 4 x 4 x 2 mm. There is no internal vascularity. This
correlates with the mammographic finding.
IMPRESSION: Probably benign left breast mass corresponding with the screening
mammographic findings. Recommend short-term imaging follow-up.

RECOMMENDATION:
Diagnostic left breast mammogram and ultrasound in 6 months.

I have discussed the findings and recommendations with the patient.
If applicable, a reminder letter will be sent to the patient
regarding the next appointment.

BI-RADS CATEGORY  3: Probably benign.

## 2023-06-30 ENCOUNTER — Other Ambulatory Visit: Payer: Self-pay | Admitting: Family Medicine

## 2023-06-30 DIAGNOSIS — Z1231 Encounter for screening mammogram for malignant neoplasm of breast: Secondary | ICD-10-CM

## 2023-08-10 ENCOUNTER — Ambulatory Visit
Admission: RE | Admit: 2023-08-10 | Discharge: 2023-08-10 | Disposition: A | Discharge status: Home / Self Care | Source: Ambulatory Visit | Attending: Family Medicine | Admitting: Family Medicine

## 2023-08-10 DIAGNOSIS — Z1231 Encounter for screening mammogram for malignant neoplasm of breast: Secondary | ICD-10-CM | POA: Diagnosis present
